# Patient Record
Sex: Female | Born: 1970 | Hispanic: No | Marital: Married | Smoking: Current every day smoker
Health system: Southern US, Community
[De-identification: ages and names within clinical notes are randomized; demographics above are authoritative.]

## PROBLEM LIST (undated history)

## (undated) DIAGNOSIS — I10 Essential (primary) hypertension: Secondary | ICD-10-CM

## (undated) DIAGNOSIS — E119 Type 2 diabetes mellitus without complications: Secondary | ICD-10-CM

## (undated) HISTORY — PX: FOOT SURGERY: SHX648

## (undated) HISTORY — PX: TONSILLECTOMY: SUR1361

---

## 2014-02-04 ENCOUNTER — Encounter (HOSPITAL_COMMUNITY): Payer: Self-pay

## 2014-02-04 ENCOUNTER — Emergency Department (HOSPITAL_COMMUNITY)
Admission: EM | Admit: 2014-02-04 | Discharge: 2014-02-04 | Disposition: A | Discharge status: Home / Self Care | Payer: Medicaid - Out of State | Attending: Emergency Medicine | Admitting: Emergency Medicine

## 2014-02-04 ENCOUNTER — Emergency Department (HOSPITAL_COMMUNITY): Payer: Medicaid - Out of State

## 2014-02-04 DIAGNOSIS — E119 Type 2 diabetes mellitus without complications: Secondary | ICD-10-CM | POA: Diagnosis not present

## 2014-02-04 DIAGNOSIS — M79671 Pain in right foot: Secondary | ICD-10-CM | POA: Diagnosis present

## 2014-02-04 DIAGNOSIS — X58XXXA Exposure to other specified factors, initial encounter: Secondary | ICD-10-CM | POA: Insufficient documentation

## 2014-02-04 DIAGNOSIS — G8918 Other acute postprocedural pain: Secondary | ICD-10-CM | POA: Diagnosis not present

## 2014-02-04 DIAGNOSIS — S92311A Displaced fracture of first metatarsal bone, right foot, initial encounter for closed fracture: Secondary | ICD-10-CM | POA: Diagnosis not present

## 2014-02-04 DIAGNOSIS — Y998 Other external cause status: Secondary | ICD-10-CM | POA: Insufficient documentation

## 2014-02-04 DIAGNOSIS — Z79899 Other long term (current) drug therapy: Secondary | ICD-10-CM | POA: Diagnosis not present

## 2014-02-04 DIAGNOSIS — Z9889 Other specified postprocedural states: Secondary | ICD-10-CM | POA: Insufficient documentation

## 2014-02-04 DIAGNOSIS — S92401A Displaced unspecified fracture of right great toe, initial encounter for closed fracture: Secondary | ICD-10-CM

## 2014-02-04 DIAGNOSIS — Z720 Tobacco use: Secondary | ICD-10-CM | POA: Diagnosis not present

## 2014-02-04 DIAGNOSIS — Y9389 Activity, other specified: Secondary | ICD-10-CM | POA: Insufficient documentation

## 2014-02-04 DIAGNOSIS — Y929 Unspecified place or not applicable: Secondary | ICD-10-CM | POA: Insufficient documentation

## 2014-02-04 DIAGNOSIS — I10 Essential (primary) hypertension: Secondary | ICD-10-CM | POA: Insufficient documentation

## 2014-02-04 DIAGNOSIS — M79673 Pain in unspecified foot: Secondary | ICD-10-CM

## 2014-02-04 HISTORY — DX: Essential (primary) hypertension: I10

## 2014-02-04 HISTORY — DX: Type 2 diabetes mellitus without complications: E11.9

## 2014-02-04 MED ORDER — OXYCODONE-ACETAMINOPHEN 5-325 MG PO TABS: 1.0000 | ORAL_TABLET | Freq: Three times a day (TID) | ORAL | Status: DC | PRN

## 2014-02-04 NOTE — ED Notes (Signed)
PA Browning at bedside. 

## 2014-02-04 NOTE — ED Provider Notes (Signed)
CSN: [id number]     Arrival date & time 02/04/14  1710 History  This chart was scribed for non-physician practitioner working with Layla MawKristen N Ward, DO by Elveria Risingimelie Horne, ED Scribe. This patient was seen in room TR05C/TR05C and the patient's care was started at 8:11 PM.   Chief Complaint  Patient presents with  . Foot Pain   HPI HPI Comments: Morgan Dickerson is a 43 y.o. female with history of Diabetes Mellitus and Hypertension who presents to the Emergency Department with chief complaint of right foot pain and swelling for several days now. Patient shares history of 6-7 past right foot surgeries; her last was a bone graft and fusion in February 2015. Patient states that her incisions did not close until 4 months later. Patient reports pain and swelling in her foot with increased activity including walking and standing. Patient reports that her symptoms have disallowed her to even drive. Patient is here from [place]; she has an upcoming appoinment with her surgeon in December.      Past Medical History  Diagnosis Date  . Diabetes mellitus without complication   . Hypertension    Past Surgical History  Procedure Laterality Date  . Foot surgery Right   . Tonsillectomy     No family history on file. History  Substance Use Topics  . Smoking status: Current Every Day Smoker  . Smokeless tobacco: Not on file  . Alcohol Use: No   OB History    No data available     Review of Systems  Constitutional: Negative for fever and chills.  Musculoskeletal: Positive for arthralgias.  Neurological: Negative for weakness and numbness.      Allergies  Morphine and related  Home Medications   Prior to Admission medications   Medication Sig Start Date End Date Taking? Authorizing Provider  gabapentin (NEURONTIN) 300 MG capsule Take 300 mg by mouth 4 (four) times daily.   Yes Historical Provider, MD  ibuprofen (ADVIL,MOTRIN) 800 MG tablet Take 800 mg by mouth every 8 (eight) hours as  needed for moderate pain.   Yes Historical Provider, MD  traMADol (ULTRAM) 50 MG tablet Take 50 mg by mouth every 6 (six) hours as needed for moderate pain.   Yes Historical Provider, MD  zolpidem (AMBIEN) 10 MG tablet Take 10 mg by mouth at bedtime as needed for sleep.   Yes Historical Provider, MD   Triage Vitals: BP 133/88 mmHg  Pulse 68  Temp(Src) 98.4 F (36.9 C) (Oral)  Resp 18  Ht 5\' 2"  (1.575 m)  Wt 170 lb (77.111 kg)  BMI 31.09 kg/m2  SpO2 97%  LMP 01/16/2014 Physical Exam  Constitutional: She is oriented to person, place, and time. She appears well-developed and well-nourished. No distress.  HENT:  Head: Normocephalic and atraumatic.  Eyes: EOM are normal.  Neck: Neck supple. No tracheal deviation present.  Cardiovascular: Normal rate and intact distal pulses.   Intact distal pulses and brisk capillary refill.   Pulmonary/Chest: Effort normal. No respiratory distress.  Musculoskeletal: Normal range of motion.  No bony ab or def.   Neurological: She is alert and oriented to person, place, and time.  Sensation intact.   Skin: Skin is warm and dry.  Well healing post surgical wound to the right great toe. No erythema, cellulitis, or abscess.   Psychiatric: She has a normal mood and affect. Her behavior is normal.  Nursing note and vitals reviewed.   ED Course  Procedures (including critical care time)  COORDINATION OF  CARE: 8:11 PM- Discussed treatment plan with patient at bedside and patient agreed to plan.   Labs Review Labs Reviewed - No data to display  Imaging Review Dg Foot Complete Right  02/04/2014   CLINICAL DATA:  Right first metatarsal pain. History of previous fusion.  EXAM: RIGHT FOOT COMPLETE - 3+ VIEW  COMPARISON:  None.  FINDINGS: Fusion changes at the first metatarsal phalangeal joint. The dorsal plate is fractured. The middle 2 screws are also fracture. No obvious acute fracture. The other joint spaces are well maintained. No acute fracture.   IMPRESSION: Fractured plate and screws involving the first metatarsal phalangeal joint.  No acute bony findings.   Electronically Signed   By: Loralie ChampagneMark  Gallerani M.D.   On: 02/04/2014 19:41     EKG Interpretation None      MDM   Final diagnoses:  Foot pain  Fractured great toe, right, closed, initial encounter    Patient with fractured plate above left first metatarsal involving the phalangeal joint.  Image reviewed with Dr. Elesa MassedWard, who recommends postop shoe, crutches, and follow-up with her orthopedic surgeon ASAP. Patient understands and agrees to plan. Discharge with some pain medicine. Patient is stable and ready for discharge.  I personally performed the services described in this documentation, which was scribed in my presence. The recorded information has been reviewed and is accurate.    Roxy Horsemanobert Aibhlinn Kalmar, PA-C 02/04/14 2048  Layla MawKristen N Ward, DO 02/04/14 2049

## 2014-02-04 NOTE — Discharge Instructions (Signed)

## 2014-02-04 NOTE — ED Notes (Signed)
Pt having right foot pain, had surgery on in feb. She is diabetic and wound didn't close until June. Her bones break easily and takes extra vitamin d. Having some swelling to her right foot over the last several days.

## 2014-02-04 NOTE — ED Notes (Signed)
Post-op shoe applied and crutches provided, pt verbalizes and demonstrates understanding of each.

## 2014-02-25 ENCOUNTER — Encounter (HOSPITAL_COMMUNITY): Payer: Self-pay

## 2014-02-25 ENCOUNTER — Emergency Department (HOSPITAL_COMMUNITY): Payer: Medicaid - Out of State

## 2014-02-25 ENCOUNTER — Emergency Department (HOSPITAL_COMMUNITY)
Admission: EM | Admit: 2014-02-25 | Discharge: 2014-02-25 | Disposition: A | Discharge status: Home / Self Care | Payer: Medicaid - Out of State | Attending: Emergency Medicine | Admitting: Emergency Medicine

## 2014-02-25 DIAGNOSIS — Z79899 Other long term (current) drug therapy: Secondary | ICD-10-CM | POA: Diagnosis not present

## 2014-02-25 DIAGNOSIS — E119 Type 2 diabetes mellitus without complications: Secondary | ICD-10-CM | POA: Diagnosis not present

## 2014-02-25 DIAGNOSIS — M79671 Pain in right foot: Secondary | ICD-10-CM

## 2014-02-25 DIAGNOSIS — Z720 Tobacco use: Secondary | ICD-10-CM | POA: Insufficient documentation

## 2014-02-25 DIAGNOSIS — I10 Essential (primary) hypertension: Secondary | ICD-10-CM | POA: Insufficient documentation

## 2014-02-25 MED ORDER — OXYCODONE-ACETAMINOPHEN 5-325 MG PO TABS: 1.0000 | ORAL_TABLET | Freq: Four times a day (QID) | ORAL | Status: AC | PRN

## 2014-02-25 MED ORDER — OXYCODONE-ACETAMINOPHEN 5-325 MG PO TABS
1.0000 | ORAL_TABLET | Freq: Once | ORAL | Status: AC
  Administered 2014-02-25: 1 via ORAL
  Filled 2014-02-25: qty 1

## 2014-02-25 NOTE — ED Notes (Signed)
Pt here for pain in right foot since feb when she had surgery for fx and had a delay in healing returns today with continued pain.

## 2014-02-25 NOTE — ED Notes (Signed)
Pt returned from xray

## 2014-02-25 NOTE — ED Provider Notes (Signed)
CSN: [id number]     Arrival date & time 02/25/14  [id number] History   First MD Initiated Contact with Patient 02/25/14 0700     Chief Complaint  Patient presents with  . Foot Pain     (Consider location/radiation/quality/duration/timing/severity/associated sxs/prior Treatment) HPI  43 year old female presents with right foot pain for several months. She had multiple surgeries by her orthopedist including a graft and plate. These were several months ago. Last month the patient came to the ER here as she is visiting family and was noted to have a fracture of her first toe. She was placed in a postop shoe states the pain is continued. She still visiting family here for another couple weeks and is unable to see her orthopedist until she goes back home. She is out of pain medicine and states that her pain is continued. Has been trying ibuprofen and tramadol with no relief. There's been no new injuries. No fevers or redness. She has had swelling over the last couple days but she does not know how this happened. Denies a weakness or numbness.  Past Medical History  Diagnosis Date  . Diabetes mellitus without complication   . Hypertension    Past Surgical History  Procedure Laterality Date  . Foot surgery Right   . Tonsillectomy     History reviewed. No pertinent family history. History  Substance Use Topics  . Smoking status: Current Every Day Smoker  . Smokeless tobacco: Not on file  . Alcohol Use: No   OB History    No data available     Review of Systems  Musculoskeletal: Positive for joint swelling and arthralgias.  Skin: Negative for color change and wound.  Neurological: Negative for weakness and numbness.  All other systems reviewed and are negative.     Allergies  Morphine and related  Home Medications   Prior to Admission medications   Medication Sig Start Date End Date Taking? Authorizing Provider  gabapentin (NEURONTIN) 300 MG capsule Take 300 mg by mouth 4 (four)  times daily.   Yes Historical Provider, MD  ibuprofen (ADVIL,MOTRIN) 800 MG tablet Take 800 mg by mouth every 8 (eight) hours as needed for moderate pain.   Yes Historical Provider, MD  mirtazapine (REMERON) 30 MG tablet Take 30 mg by mouth at bedtime.   Yes Historical Provider, MD  traMADol (ULTRAM) 50 MG tablet Take 50 mg by mouth every 6 (six) hours as needed for moderate pain.   Yes Historical Provider, MD  zolpidem (AMBIEN) 10 MG tablet Take 10 mg by mouth at bedtime as needed for sleep.   Yes Historical Provider, MD  oxyCODONE-acetaminophen (PERCOCET/ROXICET) 5-325 MG per tablet Take 1-2 tablets by mouth every 8 (eight) hours as needed for severe pain. Patient not taking: Reported on 02/25/2014 02/04/14   Roxy Horsemanobert Browning, PA-C   BP 109/49 mmHg  Pulse 98  Temp(Src) 98.9 F (37.2 C) (Oral)  Resp 18  Ht 5\' 2"  (1.575 m)  Wt 170 lb (77.111 kg)  BMI 31.09 kg/m2  SpO2 98%  LMP 01/16/2014 Physical Exam  Constitutional: She is oriented to person, place, and time. She appears well-developed and well-nourished.  HENT:  Head: Normocephalic and atraumatic.  Right Ear: External ear normal.  Left Ear: External ear normal.  Nose: Nose normal.  Eyes: Right eye exhibits no discharge. Left eye exhibits no discharge.  Cardiovascular: Normal rate and intact distal pulses.   Pulses:      Dorsalis pedis pulses are 2+ on the right side.  Pulmonary/Chest: Breath sounds normal.  Musculoskeletal:       Right ankle: She exhibits normal range of motion. No tenderness.       Right foot: There is tenderness and swelling. There is no deformity and no laceration.       Feet:  Neurological: She is alert and oriented to person, place, and time.  Skin: Skin is warm and dry. No erythema.  Nursing note and vitals reviewed.   ED Course  Procedures (including critical care time) Labs Review Labs Reviewed - No data to display  Imaging Review Dg Foot Complete Right  02/25/2014   CLINICAL DATA:  Pain and  swelling.  EXAM: RIGHT FOOT COMPLETE - 3+ VIEW  COMPARISON:  02/04/2014  FINDINGS: Surgical fusion of the first metatarsal phalyngeal joint. Dorsal plate unchanged in position. The plate is fractured which is unchanged. One of the screws in the mid plate is also fractured which is unchanged. Screw fragment in the first metatarsal unchanged. It is difficult to evaluate for bony fusion due to overlying plate which obscures the joint space.  No acute fracture.  Remainder of the joints are normal.  IMPRESSION: Surgical fusion of the first MTP with plate and screw fracture unchanged from the recent study. No superimposed acute abnormality.   Electronically Signed   By: Marlan Palauharles  Clark M.D.   On: 02/25/2014 07:44     EKG Interpretation None      MDM   Final diagnoses:  Right foot pain    Due to swelling, xray obtained, no changes since 1 month ago. Patient is neurovascularly intact, appears to mostly be a acute on chronic pain control issue. Will give Rx for percocet and recommend ASAP follow up with her orthopedist in a couple weeks when she gets home.    Audree CamelScott T Aspynn Clover, MD 02/25/14 [phone]

## 2014-02-25 NOTE — ED Notes (Signed)
Pt gone to xray

## 2016-08-04 IMAGING — CR DG FOOT COMPLETE 3+V*R*
3 series · 3 of 3 positions shown · non-contrast
Comparison: 02/04/2014

CLINICAL DATA: Pain and swelling.

EXAM:
RIGHT FOOT COMPLETE - 3+ VIEW

[foot ap]
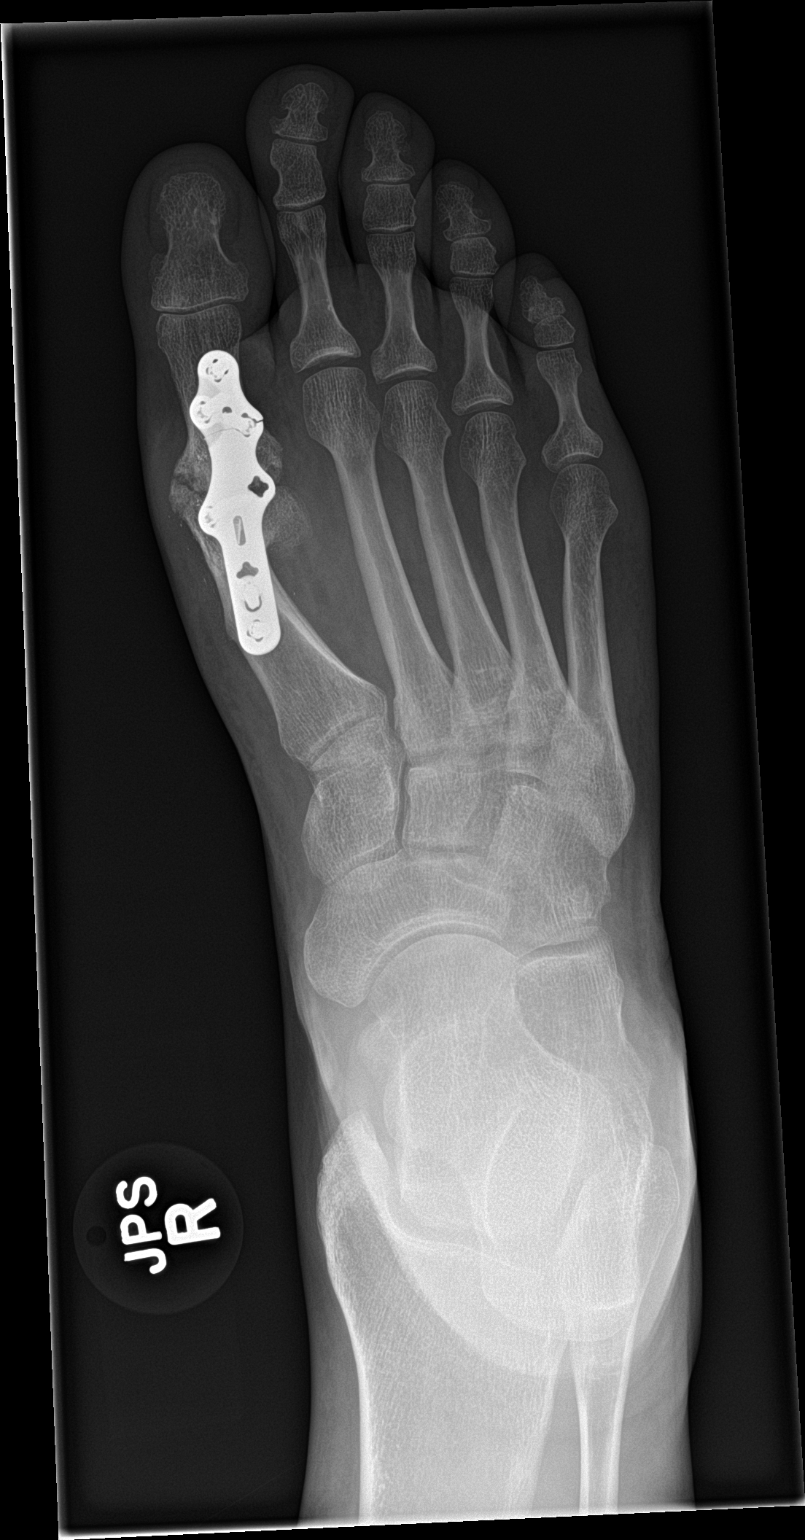

[foot obl]
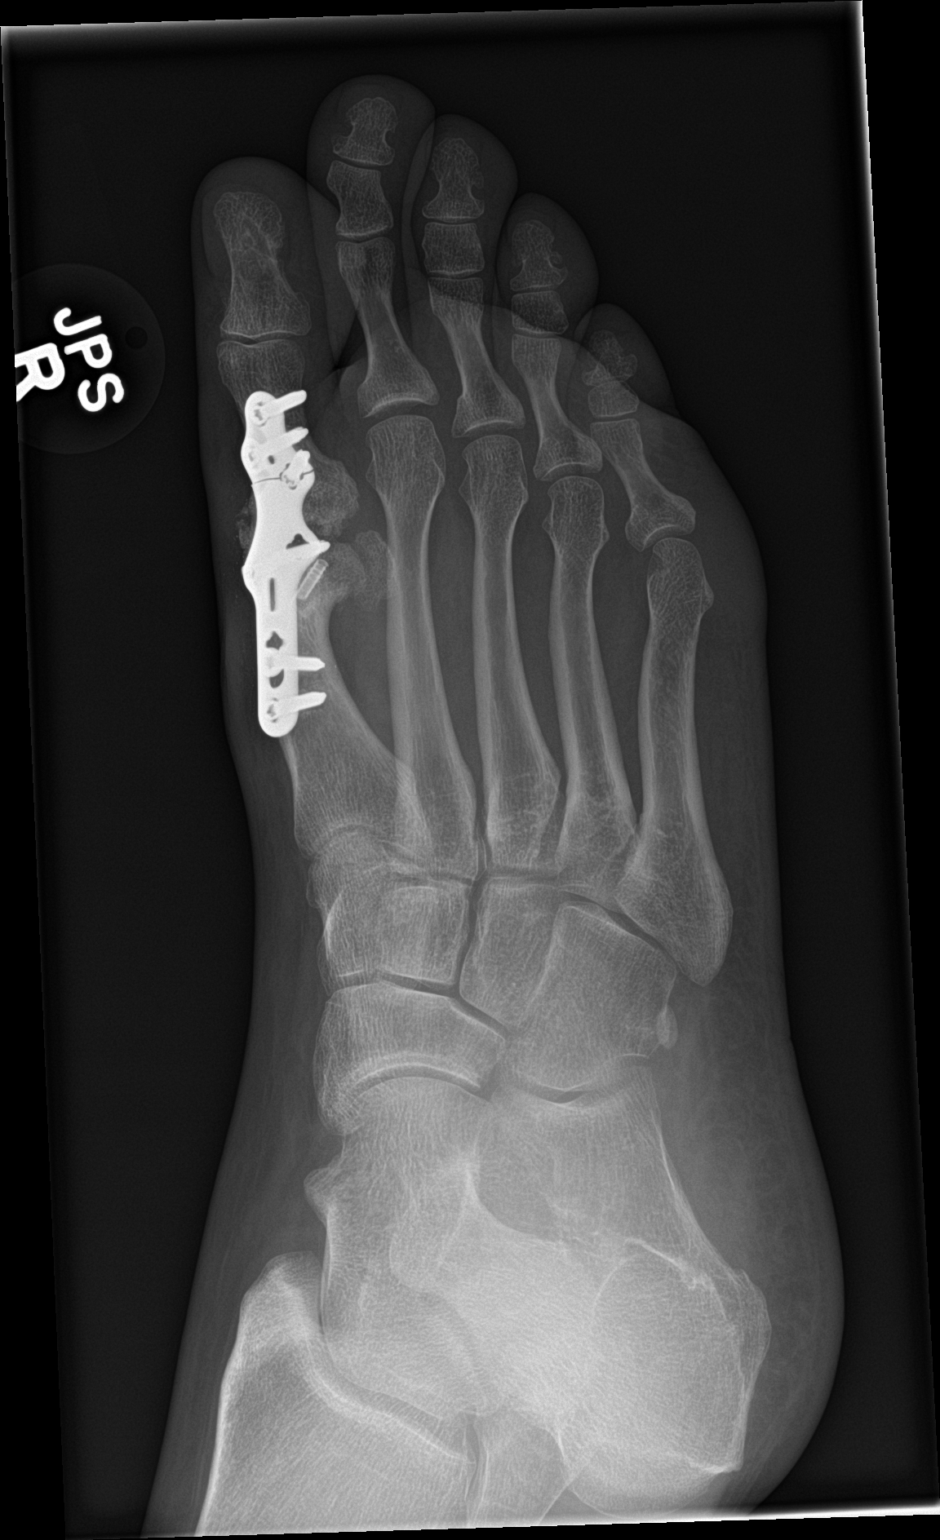

[foot lat]
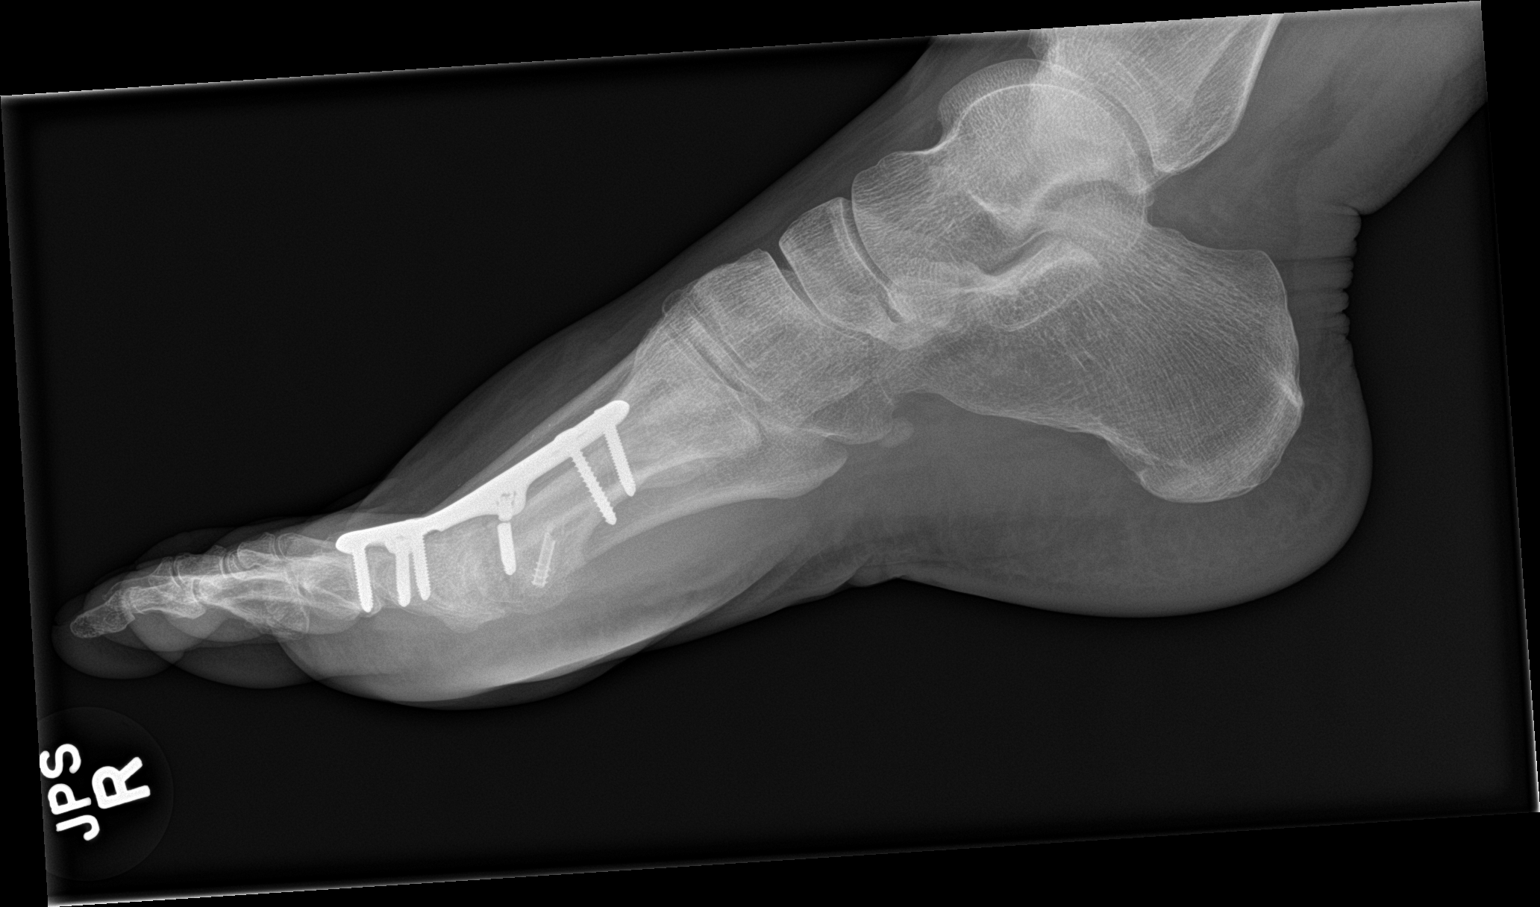

[3 of 3 positions shown; findings below may reference images not displayed]

FINDINGS: Surgical fusion of the first metatarsal phalyngeal joint. Dorsal
plate unchanged in position. The plate is fractured which is
unchanged. One of the screws in the mid plate is also fractured
which is unchanged. Screw fragment in the first metatarsal
unchanged. It is difficult to evaluate for bony fusion due to
overlying plate which obscures the joint space.

No acute fracture.  Remainder of the joints are normal.
IMPRESSION: Surgical fusion of the first MTP with plate and screw fracture
unchanged from the recent study. No superimposed acute abnormality.
